# Patient Record
Sex: Male | Born: 1935 | Race: White | Hispanic: No | Smoking: Current every day smoker
Health system: Southern US, Community
[De-identification: ages and names within clinical notes are randomized; demographics above are authoritative.]

## PROBLEM LIST (undated history)

## (undated) DIAGNOSIS — E785 Hyperlipidemia, unspecified: Secondary | ICD-10-CM

## (undated) DIAGNOSIS — K219 Gastro-esophageal reflux disease without esophagitis: Secondary | ICD-10-CM

## (undated) DIAGNOSIS — I10 Essential (primary) hypertension: Secondary | ICD-10-CM

---

## 2010-08-18 ENCOUNTER — Emergency Department (INDEPENDENT_AMBULATORY_CARE_PROVIDER_SITE_OTHER): Payer: Medicare Other

## 2010-08-18 ENCOUNTER — Emergency Department (HOSPITAL_BASED_OUTPATIENT_CLINIC_OR_DEPARTMENT_OTHER)
Admission: EM | Admit: 2010-08-18 | Discharge: 2010-08-18 | Disposition: A | Discharge status: Home / Self Care | Payer: Medicare Other | Attending: Emergency Medicine | Admitting: Emergency Medicine

## 2010-08-18 DIAGNOSIS — IMO0002 Reserved for concepts with insufficient information to code with codable children: Secondary | ICD-10-CM

## 2010-08-18 DIAGNOSIS — S62639A Displaced fracture of distal phalanx of unspecified finger, initial encounter for closed fracture: Secondary | ICD-10-CM | POA: Insufficient documentation

## 2010-08-18 DIAGNOSIS — I10 Essential (primary) hypertension: Secondary | ICD-10-CM | POA: Insufficient documentation

## 2010-08-18 DIAGNOSIS — S61209A Unspecified open wound of unspecified finger without damage to nail, initial encounter: Secondary | ICD-10-CM

## 2010-08-18 DIAGNOSIS — F17200 Nicotine dependence, unspecified, uncomplicated: Secondary | ICD-10-CM | POA: Insufficient documentation

## 2010-08-18 DIAGNOSIS — Y92009 Unspecified place in unspecified non-institutional (private) residence as the place of occurrence of the external cause: Secondary | ICD-10-CM | POA: Insufficient documentation

## 2010-08-18 DIAGNOSIS — E785 Hyperlipidemia, unspecified: Secondary | ICD-10-CM | POA: Insufficient documentation

## 2010-08-27 ENCOUNTER — Emergency Department (HOSPITAL_BASED_OUTPATIENT_CLINIC_OR_DEPARTMENT_OTHER)
Admission: EM | Admit: 2010-08-27 | Discharge: 2010-08-27 | Disposition: A | Discharge status: Home / Self Care | Payer: Medicare Other | Attending: Emergency Medicine | Admitting: Emergency Medicine

## 2010-08-27 DIAGNOSIS — I10 Essential (primary) hypertension: Secondary | ICD-10-CM | POA: Insufficient documentation

## 2010-08-27 DIAGNOSIS — Z4802 Encounter for removal of sutures: Secondary | ICD-10-CM | POA: Insufficient documentation

## 2010-08-27 DIAGNOSIS — E785 Hyperlipidemia, unspecified: Secondary | ICD-10-CM | POA: Insufficient documentation

## 2010-08-27 DIAGNOSIS — F17200 Nicotine dependence, unspecified, uncomplicated: Secondary | ICD-10-CM | POA: Insufficient documentation

## 2017-07-30 ENCOUNTER — Encounter (HOSPITAL_BASED_OUTPATIENT_CLINIC_OR_DEPARTMENT_OTHER): Payer: Self-pay | Admitting: *Deleted

## 2017-07-30 ENCOUNTER — Emergency Department (HOSPITAL_BASED_OUTPATIENT_CLINIC_OR_DEPARTMENT_OTHER): Payer: Medicare Other

## 2017-07-30 ENCOUNTER — Emergency Department (HOSPITAL_BASED_OUTPATIENT_CLINIC_OR_DEPARTMENT_OTHER)
Admission: EM | Admit: 2017-07-30 | Discharge: 2017-07-30 | Disposition: A | Discharge status: Home / Self Care | Payer: Medicare Other | Attending: Emergency Medicine | Admitting: Emergency Medicine

## 2017-07-30 ENCOUNTER — Other Ambulatory Visit: Payer: Self-pay

## 2017-07-30 DIAGNOSIS — W5551XA Bitten by raccoon, initial encounter: Secondary | ICD-10-CM | POA: Insufficient documentation

## 2017-07-30 DIAGNOSIS — Z23 Encounter for immunization: Secondary | ICD-10-CM | POA: Diagnosis not present

## 2017-07-30 DIAGNOSIS — S61451A Open bite of right hand, initial encounter: Secondary | ICD-10-CM | POA: Diagnosis not present

## 2017-07-30 DIAGNOSIS — F17210 Nicotine dependence, cigarettes, uncomplicated: Secondary | ICD-10-CM | POA: Insufficient documentation

## 2017-07-30 DIAGNOSIS — Z203 Contact with and (suspected) exposure to rabies: Secondary | ICD-10-CM | POA: Diagnosis not present

## 2017-07-30 DIAGNOSIS — Y9389 Activity, other specified: Secondary | ICD-10-CM | POA: Diagnosis not present

## 2017-07-30 DIAGNOSIS — Y9289 Other specified places as the place of occurrence of the external cause: Secondary | ICD-10-CM | POA: Insufficient documentation

## 2017-07-30 DIAGNOSIS — I10 Essential (primary) hypertension: Secondary | ICD-10-CM | POA: Insufficient documentation

## 2017-07-30 DIAGNOSIS — Y998 Other external cause status: Secondary | ICD-10-CM | POA: Insufficient documentation

## 2017-07-30 HISTORY — DX: Gastro-esophageal reflux disease without esophagitis: K21.9

## 2017-07-30 HISTORY — DX: Essential (primary) hypertension: I10

## 2017-07-30 HISTORY — DX: Hyperlipidemia, unspecified: E78.5

## 2017-07-30 MED ORDER — RABIES IMMUNE GLOBULIN 150 UNIT/ML IM INJ
20.0000 [IU]/kg | INJECTION | Freq: Once | INTRAMUSCULAR | Status: AC
  Administered 2017-07-30: 1575 [IU] via INTRAMUSCULAR
  Filled 2017-07-30: qty 12

## 2017-07-30 MED ORDER — AMOXICILLIN-POT CLAVULANATE 875-125 MG PO TABS
1.0000 | ORAL_TABLET | Freq: Once | ORAL | Status: AC
  Administered 2017-07-30: 1 via ORAL
  Filled 2017-07-30: qty 1

## 2017-07-30 MED ORDER — AMOXICILLIN-POT CLAVULANATE 875-125 MG PO TABS: 1.0000 | ORAL_TABLET | Freq: Two times a day (BID) | ORAL | 0 refills | Status: AC

## 2017-07-30 MED ORDER — RABIES VACCINE, PCEC IM SUSR
1.0000 mL | Freq: Once | INTRAMUSCULAR | Status: AC
  Administered 2017-07-30: 1 mL via INTRAMUSCULAR
  Filled 2017-07-30: qty 1

## 2017-07-30 MED ORDER — TETANUS-DIPHTH-ACELL PERTUSSIS 5-2.5-18.5 LF-MCG/0.5 IM SUSP
0.5000 mL | Freq: Once | INTRAMUSCULAR | Status: AC
  Administered 2017-07-30: 0.5 mL via INTRAMUSCULAR
  Filled 2017-07-30: qty 0.5

## 2017-07-30 NOTE — ED Notes (Signed)
                                  RABIES VACCINE FOLLOW UP  Patient's Name: Andres Sweeney                     Original Order Date:07/30/2017  Medical Record Number: [id number]  ED Physician: Benjiman CorePickering, Nathan, MD Primary Diagnosis: Rabies Exposure       PCP: Dan Makerrr, Richard L., MD  Patient Phone Number: (home) [phone] (home)    (cell)  No relevant phone numbers on file.    (work) There is no work phone number on file. Species of Animal: Steffanie DunnRaccoon   You have been seen in the Emergency Department for a possible rabies exposure. It's very important you return for the additional vaccine doses.  Please call the clinic listed below for hours of operation.   Clinic that will administer your rabies vaccines:    DAY 0:  07/30/2017      DAY 3:  08/02/2017       DAY 7:  08/06/2017     DAY 14:  08/13/2017         The 5th vaccine injection is considered for immune compromised patients only.  DAY 28:  08/27/2017

## 2017-07-30 NOTE — ED Triage Notes (Signed)
Pt c/o racoon bite to right hand x 1 day ago

## 2017-07-30 NOTE — ED Notes (Signed)
Pt lac soaking in iodine/water solution.

## 2017-07-30 NOTE — ED Notes (Signed)
Patient transported to X-ray 

## 2017-07-30 NOTE — Discharge Instructions (Signed)
You were seen in the emergency department today for a raccoon bite.  Your tetanus and rabies vaccines were administered.  You were started on antibiotic, Augmentin, help prevent infection of the wound. Please take all of your antibiotics until finished. You may develop abdominal discomfort or diarrhea from the antibiotic.  You may help offset this with probiotics which you can buy at the store (ask your pharmacist if unable to find) or get probiotics in the form of eating yogurt. Do not eat or take the probiotics until 2 hours after your antibiotic. If you are unable to tolerate these side effects follow-up with your primary care provider or return to the emergency department.   If you begin to experience any blistering, rashes, swelling, or difficulty breathing seek medical care for evaluation of potentially more serious side effects.   Please be aware that this medication may interact with other medications you are taking, please be sure to discuss your medication list with your pharmacist.  Please see the attached letter for information regarding rabies vaccination schedule.  You will read need to receive multiple vaccines for rabies to complete the series.  You need to follow-up as scheduled in the instructions.  The ER for any new or worsening symptoms including but not limited to redness around the wound, discharge from the wound, fever, chills, or any other concerns that you may have.

## 2017-07-30 NOTE — ED Provider Notes (Signed)
[facility] EMERGENCY DEPARTMENT Provider Note   CSN: [id number] Arrival date & time: 07/30/17  1532     History   Chief Complaint Chief Complaint  Patient presents with  . Animal Bite    HPI Andres Sweeney is a 84 y.o. male with a history of tobacco abuse, hypertension, and hyperlipidemia who presents to the emergency department status post raccoon bite yesterday.  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  He states when he went to move the trap and the raccoon bit him through the cage on his right second finger. States he soaked the area in alcohol/peroxide. States it is not particularly painful. No alleviating/aggravating factors. He no longer has the raccoon for testing. No other areas of injury, no other complaints at this time.   HPI  Past Medical History:  Diagnosis Date  . GERD (gastroesophageal reflux disease)   . Hyperlipidemia   . Hypertension     There are no active problems to display for this patient.   History reviewed. No pertinent surgical history.      Home Medications    Prior to Admission medications   Not on File    Family History History reviewed. No pertinent family history.  Social History Social History   Tobacco Use  . Smoking status: Current Every Day Smoker    Packs/day: 0.50  Substance Use Topics  . Alcohol use: Never    Frequency: Never  . Drug use: Never     Allergies   Patient has no known allergies.   Review of Systems Review of Systems  Constitutional: Negative for chills and fever.  Eyes: Negative for visual disturbance.  Respiratory: Negative for shortness of breath.   Cardiovascular: Negative for chest pain and palpitations.  Gastrointestinal: Negative for nausea and vomiting.  Skin: Positive for wound.  Neurological: Negative for weakness and numbness.     Physical Exam Updated Vital Signs BP  (!) 149/94 (BP Location: Left Arm)   Pulse 82   Temp 97.6 F (36.4 C)   Resp 18   Ht 5\' 4"  (1.626 m)   Wt 78 kg (172 lb)   SpO2 97%   BMI 29.52 kg/m   Physical Exam  Constitutional: He appears well-developed and well-nourished. No distress.  HENT:  Head: Normocephalic and atraumatic.  Eyes: Conjunctivae are normal. Right eye exhibits no discharge. Left eye exhibits no discharge.  Cardiovascular: Normal rate and regular rhythm.  No murmur heard. Pulses:      Radial pulses are 2+ on the right side, and 2+ on the left side.  Pulmonary/Chest: Effort normal and breath sounds normal.  Musculoskeletal:  Upper extremities: Right second digit: There is a 3-4 mm laceration just proximal to the nailbed.  There is an additional laceration that is about 1 cm in length just lateral to the nail bed (radial aspect) which extends to palmar surface.  No active bleeding.  No appreciable foreign body.  Nail bed does not appear interrupted.  Pictured below.  Patient has full range of motion at DIP/PIP/MCP joint.  Area is nontender.  Neurological: He is alert.  Clear speech.  Sensation grossly intact bilateral upper extremities.  Patient has 5 out of 5 symmetric grip strength.  Skin: Capillary refill takes less than 2 seconds.  Psychiatric: He has a normal mood and affect. His behavior is normal. Thought content normal.  Nursing note  and vitals reviewed.        ED Treatments / Results  Labs (all labs ordered are listed, but only abnormal results are displayed) Labs Reviewed - No data to display  EKG None  Radiology Dg Hand Complete Right  Result Date: 07/30/2017 CLINICAL DATA:  Raccoon bite  yesterday index finger EXAM: RIGHT HAND - COMPLETE 3+ VIEW COMPARISON:  None. FINDINGS: Negative for fracture.  Negative for foreign body Moderate degenerative change in the second and third MCP joints. No erosive arthropathy. Mild degenerative change in the wrist joint. IMPRESSION: No acute abnormality.  Electronically Signed   By: Marlan Palau M.D.   On: 07/30/2017 17:52   Procedures Procedures (including critical care time)  Medications Ordered in ED Medications  rabies immune globulin (HYPERAB/KEDRAB) injection 1,575 Units (has no administration in time range)  rabies vaccine (RABAVERT) injection 1 mL (has no administration in time range)  amoxicillin-clavulanate (AUGMENTIN) 875-125 MG per tablet 1 tablet (has no administration in time range)  Tdap (BOOSTRIX) injection 0.5 mL (has no administration in time range)    Initial Impression / Assessment and Plan / ED Course  I have reviewed the triage vital signs and the nursing notes.  Pertinent labs & imaging results that were available during my care of the patient were reviewed by me and considered in my medical decision making (see chart for details).   Patient presents with animal bite that occurred yesterday. Patient is nontoxic appearing, in no apparent distress, vitals WNL with the exception of elevated blood pressure, do not suspect HTN emergency, patient aware of need for recheck. Exam as above. Wounds do not appear to require closure. There is no active bleeding or appreciable foreign body. X-ray obtained and negative. Patient NVI distally. Wounds pressure irrigated with 1.5 L NS, cleaned with betadine, abx ointment and bandages applied. Tetanus updated. Rabies vaccination series initiated. Will DC home with Augmentin and instructions for follow up for vaccine series. I discussed results, treatment plan, need for follow-up, and return precautions with the patient. Provided opportunity for questions, patient confirmed understanding and is in agreement with plan.   Findings and plan of care discussed with supervising physician Dr. Rubin Payor who is in agreement with plan.   Final Clinical Impressions(s) / ED Diagnoses   Final diagnoses:  Raccoon bite, initial encounter  Need for rabies vaccination    ED Discharge Orders         Ordered    amoxicillin-clavulanate (AUGMENTIN) 875-125 MG tablet  Every 12 hours     07/30/17 1810       Ohana Birdwell, Pleas Koch, PA-C 07/30/17 Mayer Camel, MD 07/31/17 1558

## 2017-08-02 ENCOUNTER — Ambulatory Visit (HOSPITAL_COMMUNITY)
Admission: EM | Admit: 2017-08-02 | Discharge: 2017-08-02 | Disposition: A | Discharge status: Home / Self Care | Payer: Medicare Other | Attending: Internal Medicine | Admitting: Internal Medicine

## 2017-08-02 DIAGNOSIS — Z203 Contact with and (suspected) exposure to rabies: Secondary | ICD-10-CM

## 2017-08-02 DIAGNOSIS — Z23 Encounter for immunization: Secondary | ICD-10-CM | POA: Diagnosis not present

## 2017-08-02 MED ORDER — RABIES VACCINE, PCEC IM SUSR
1.0000 mL | Freq: Once | INTRAMUSCULAR | Status: AC
  Administered 2017-08-02: 1 mL via INTRAMUSCULAR

## 2017-08-02 MED ORDER — RABIES VACCINE, PCEC IM SUSR
INTRAMUSCULAR | Status: AC
  Filled 2017-08-02: qty 1

## 2017-08-02 NOTE — ED Triage Notes (Signed)
Pt presents for rabies vaccine after initial treatment 4/17.  Pt denies any c/o's RE: bite site; denies need to have provider eval.

## 2017-08-06 ENCOUNTER — Encounter (HOSPITAL_COMMUNITY): Payer: Self-pay | Admitting: Emergency Medicine

## 2017-08-06 ENCOUNTER — Ambulatory Visit (HOSPITAL_COMMUNITY)
Admission: EM | Admit: 2017-08-06 | Discharge: 2017-08-06 | Disposition: A | Discharge status: Home / Self Care | Payer: Medicare Other | Attending: Family Medicine | Admitting: Family Medicine

## 2017-08-06 DIAGNOSIS — Z23 Encounter for immunization: Secondary | ICD-10-CM | POA: Diagnosis not present

## 2017-08-06 DIAGNOSIS — Z203 Contact with and (suspected) exposure to rabies: Secondary | ICD-10-CM | POA: Diagnosis not present

## 2017-08-06 MED ORDER — RABIES VACCINE, PCEC IM SUSR
INTRAMUSCULAR | Status: AC
  Filled 2017-08-06: qty 1

## 2017-08-06 MED ORDER — RABIES VACCINE, PCEC IM SUSR
1.0000 mL | Freq: Once | INTRAMUSCULAR | Status: AC
  Administered 2017-08-06: 1 mL via INTRAMUSCULAR

## 2017-08-06 NOTE — ED Triage Notes (Signed)
Pt here for rabies series; given in right arm

## 2017-08-13 ENCOUNTER — Ambulatory Visit (HOSPITAL_COMMUNITY)
Admission: EM | Admit: 2017-08-13 | Discharge: 2017-08-13 | Disposition: A | Discharge status: Home / Self Care | Payer: Medicare Other | Attending: Internal Medicine | Admitting: Internal Medicine

## 2017-08-13 DIAGNOSIS — S60460S Insect bite (nonvenomous) of right index finger, sequela: Secondary | ICD-10-CM | POA: Diagnosis not present

## 2017-08-13 DIAGNOSIS — Z23 Encounter for immunization: Secondary | ICD-10-CM | POA: Diagnosis not present

## 2017-08-13 DIAGNOSIS — Z203 Contact with and (suspected) exposure to rabies: Secondary | ICD-10-CM | POA: Diagnosis not present

## 2017-08-13 MED ORDER — RABIES VACCINE, PCEC IM SUSR
1.0000 mL | Freq: Once | INTRAMUSCULAR | Status: AC
  Administered 2017-08-13: 1 mL via INTRAMUSCULAR

## 2017-08-13 MED ORDER — RABIES VACCINE, PCEC IM SUSR
INTRAMUSCULAR | Status: AC
  Filled 2017-08-13: qty 1

## 2017-08-13 NOTE — ED Notes (Signed)
Pt here for final rabies shot

## 2020-03-04 IMAGING — CR DG HAND COMPLETE 3+V*R*
3 series · 3 of 3 positions shown · non-contrast
Comparison: None.

CLINICAL DATA: Raccoon bite  yesterday index finger

EXAM:
RIGHT HAND - COMPLETE 3+ VIEW

[x hand pa right]
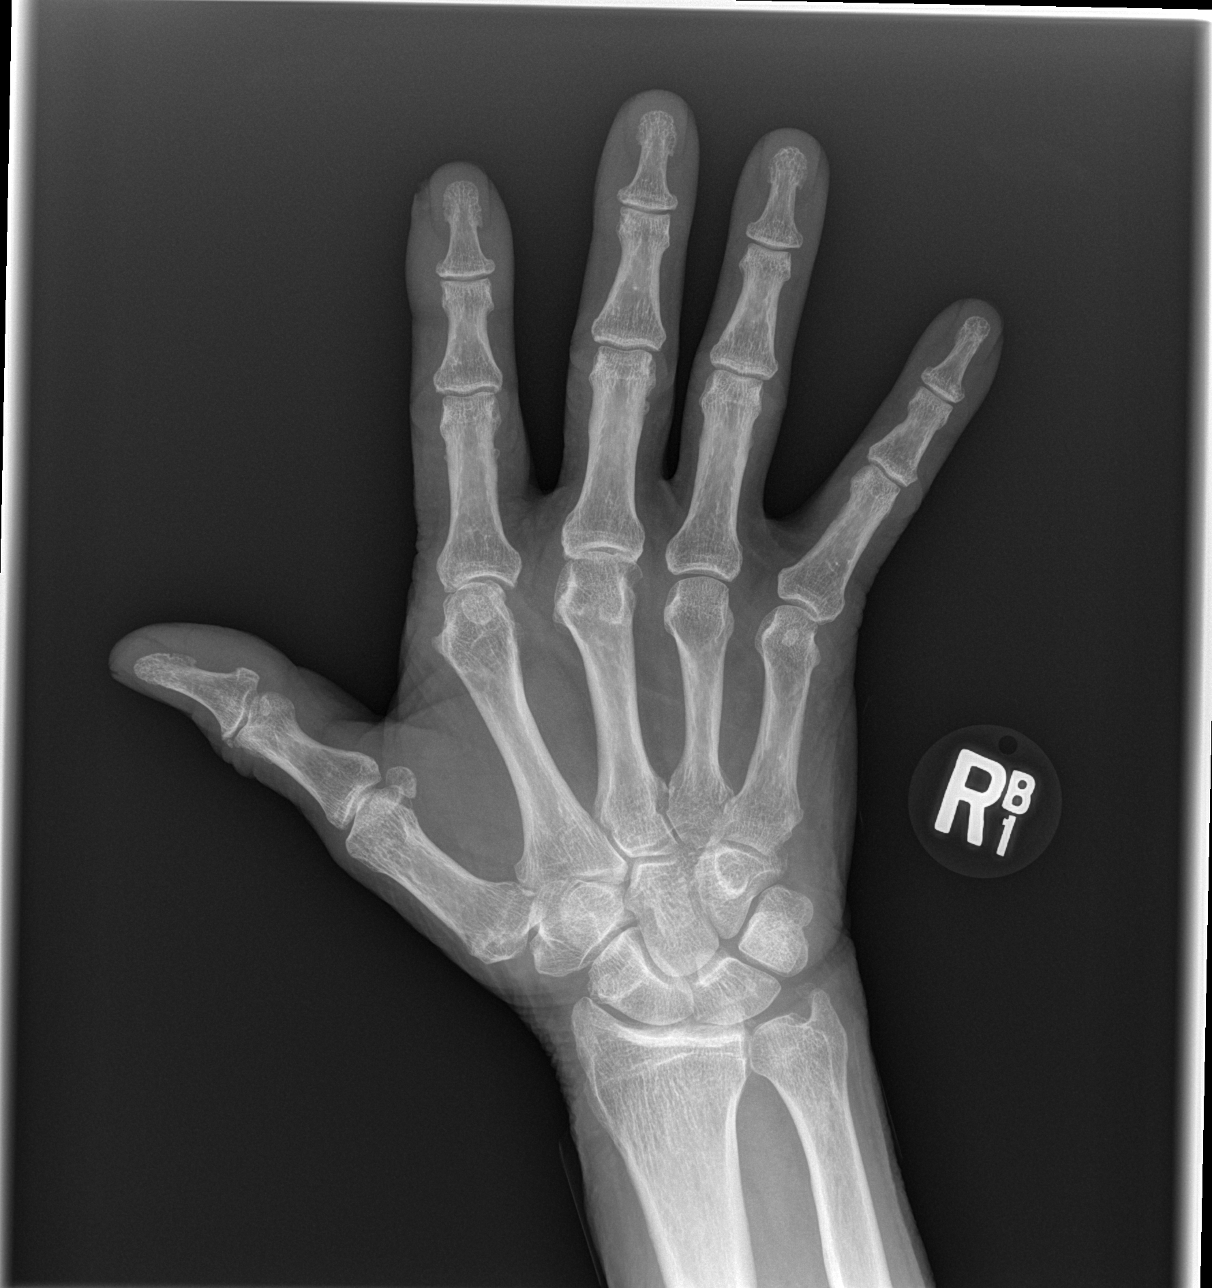

[x hand oblique right]
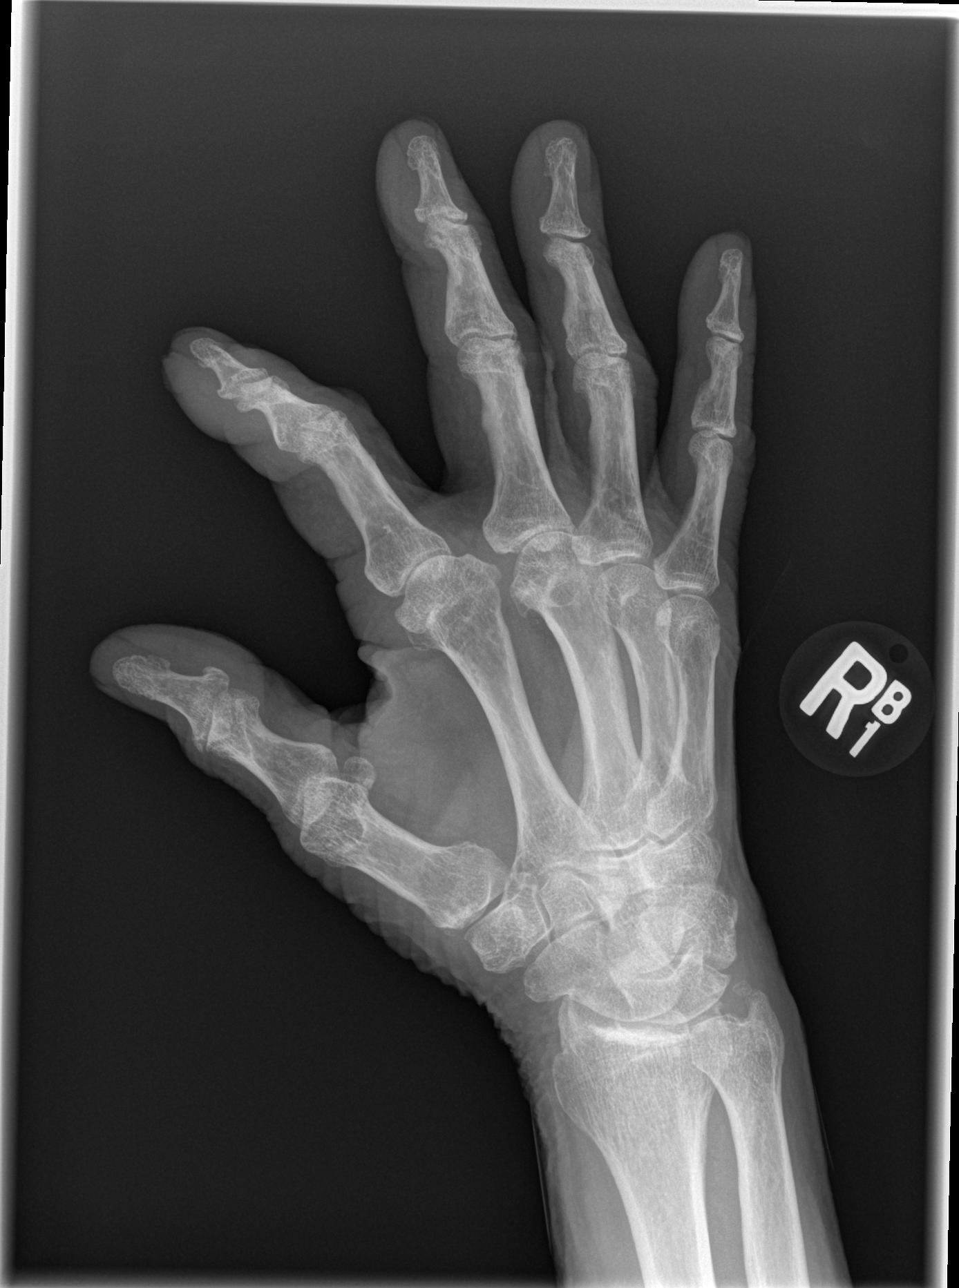

[x hand lat right]
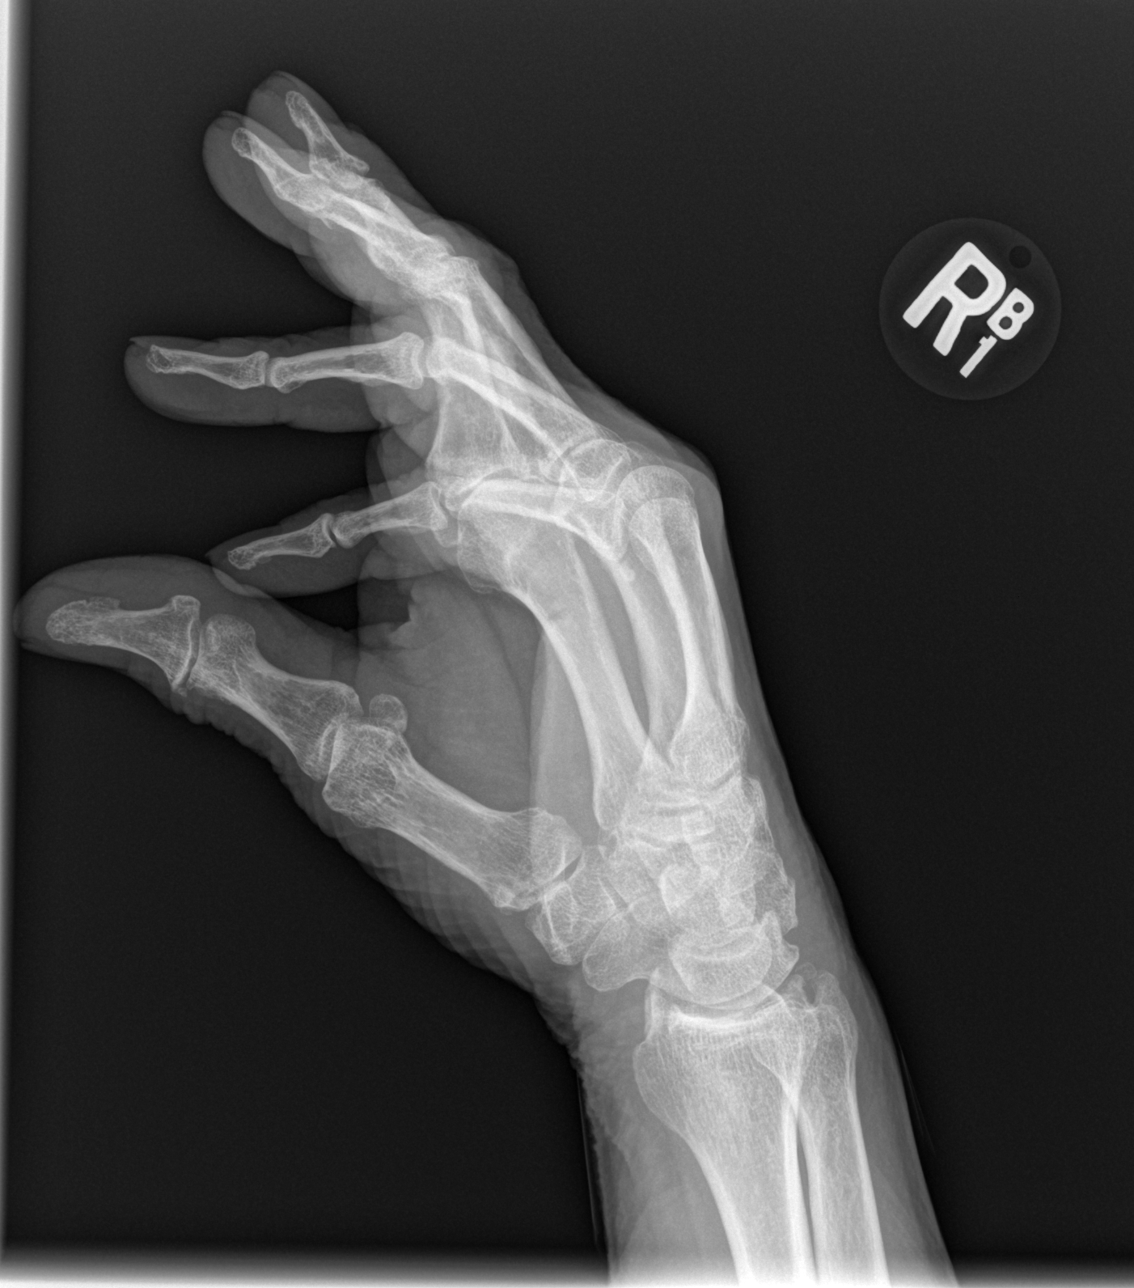

[3 of 3 positions shown; findings below may reference images not displayed]

FINDINGS: Negative for fracture.  Negative for foreign body

Moderate degenerative change in the second and third MCP joints. No
erosive arthropathy. Mild degenerative change in the wrist joint.
IMPRESSION: No acute abnormality.

## 2020-03-15 DEATH — deceased
